# Patient Record
Sex: Male | Born: 1990 | Race: White | Hispanic: No | Marital: Single | Smoking: Former smoker
Health system: Southern US, Community
[De-identification: ages and names within clinical notes are randomized; demographics above are authoritative.]

## PROBLEM LIST (undated history)

## (undated) DIAGNOSIS — K37 Unspecified appendicitis: Secondary | ICD-10-CM

## (undated) HISTORY — DX: Unspecified appendicitis: K37

## (undated) HISTORY — PX: TONSILLECTOMY: SUR1361

---
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
## 2012-08-08 ENCOUNTER — Emergency Department: Payer: Self-pay | Admitting: Emergency Medicine

## 2012-08-09 LAB — CBC WITH DIFFERENTIAL/PLATELET
Basophil #: 0.1 10*3/uL (ref 0.0–0.1)
Basophil %: 0.6 %
Lymphocyte %: 26.7 %
MCH: 31.9 pg (ref 26.0–34.0)
Monocyte %: 9.6 %
Neutrophil %: 57.9 %
Platelet: 218 10*3/uL (ref 150–440)
RBC: 4.83 10*6/uL (ref 4.40–5.90)
RDW: 13.4 % (ref 11.5–14.5)

## 2012-08-09 LAB — COMPREHENSIVE METABOLIC PANEL
Alkaline Phosphatase: 95 U/L (ref 50–136)
Anion Gap: 7 (ref 7–16)
BUN: 15 mg/dL (ref 7–18)
Bilirubin,Total: 0.7 mg/dL (ref 0.2–1.0)
Calcium, Total: 8.5 mg/dL (ref 8.5–10.1)
Chloride: 104 mmol/L (ref 98–107)
Co2: 27 mmol/L (ref 21–32)
EGFR (African American): >60
EGFR (Non-African Amer.): >60
Glucose: 93 mg/dL (ref 65–99)
Osmolality: 276 (ref 275–301)
Potassium: 3.6 mmol/L (ref 3.5–5.1)
SGPT (ALT): 25 U/L (ref 12–78)
Sodium: 138 mmol/L (ref 136–145)
Total Protein: 6.9 g/dL (ref 6.4–8.2)

## 2012-08-09 LAB — TROPONIN I: Troponin-I: <0.02 ng/mL

## 2012-08-09 LAB — URINALYSIS, COMPLETE
Bacteria: NONE SEEN
Glucose,UR: NEGATIVE mg/dL (ref 0–75)
Ketone: NEGATIVE
Leukocyte Esterase: NEGATIVE
Nitrite: NEGATIVE
Ph: 7 (ref 4.5–8.0)
RBC,UR: 5 /HPF (ref 0–5)
Specific Gravity: 1.009 (ref 1.003–1.030)
WBC UR: <1 /HPF (ref 0–5)

## 2012-08-09 LAB — CK TOTAL AND CKMB (NOT AT ARMC): CK, Total: 425 U/L — ABNORMAL HIGH (ref 35–232)

## 2015-11-29 ENCOUNTER — Observation Stay
Admission: EM | Admit: 2015-11-29 | Discharge: 2015-11-30 | Disposition: A | Discharge status: Home / Self Care | Payer: 59 | Attending: General Surgery | Admitting: General Surgery

## 2015-11-29 ENCOUNTER — Encounter: Payer: Self-pay | Admitting: Emergency Medicine

## 2015-11-29 DIAGNOSIS — K353 Acute appendicitis with localized peritonitis, without perforation or gangrene: Secondary | ICD-10-CM

## 2015-11-29 DIAGNOSIS — K37 Unspecified appendicitis: Secondary | ICD-10-CM | POA: Diagnosis present

## 2015-11-29 DIAGNOSIS — F17210 Nicotine dependence, cigarettes, uncomplicated: Secondary | ICD-10-CM | POA: Insufficient documentation

## 2015-11-29 DIAGNOSIS — Z9889 Other specified postprocedural states: Secondary | ICD-10-CM | POA: Insufficient documentation

## 2015-11-29 DIAGNOSIS — K3580 Unspecified acute appendicitis: Secondary | ICD-10-CM | POA: Diagnosis present

## 2015-11-29 LAB — COMPREHENSIVE METABOLIC PANEL
ALT: 58 U/L (ref 17–63)
AST: 59 U/L — AB (ref 15–41)
Albumin: 4.7 g/dL (ref 3.5–5.0)
Alkaline Phosphatase: 75 U/L (ref 38–126)
Anion gap: 4 — ABNORMAL LOW (ref 5–15)
BUN: 15 mg/dL (ref 6–20)
CHLORIDE: 106 mmol/L (ref 101–111)
CO2: 27 mmol/L (ref 22–32)
CREATININE: 1.04 mg/dL (ref 0.61–1.24)
Calcium: 9.2 mg/dL (ref 8.9–10.3)
GFR calc non Af Amer: >60 mL/min (ref >60)
Glucose, Bld: 106 mg/dL — ABNORMAL HIGH (ref 65–99)
POTASSIUM: 4 mmol/L (ref 3.5–5.1)
SODIUM: 137 mmol/L (ref 135–145)
Total Bilirubin: 0.9 mg/dL (ref 0.3–1.2)
Total Protein: 7.6 g/dL (ref 6.5–8.1)

## 2015-11-29 LAB — URINALYSIS COMPLETE WITH MICROSCOPIC (ARMC ONLY)
BACTERIA UA: NONE SEEN
BILIRUBIN URINE: NEGATIVE
Glucose, UA: NEGATIVE mg/dL
HGB URINE DIPSTICK: NEGATIVE
Ketones, ur: NEGATIVE mg/dL
LEUKOCYTES UA: NEGATIVE
Nitrite: NEGATIVE
PH: 7 (ref 5.0–8.0)
PROTEIN: NEGATIVE mg/dL
RBC / HPF: NONE SEEN RBC/hpf (ref 0–5)
SQUAMOUS EPITHELIAL / LPF: NONE SEEN
Specific Gravity, Urine: 1.017 (ref 1.005–1.030)

## 2015-11-29 LAB — CBC
HEMATOCRIT: 47.8 % (ref 40.0–52.0)
Hemoglobin: 16.2 g/dL (ref 13.0–18.0)
MCH: 32 pg (ref 26.0–34.0)
MCHC: 34 g/dL (ref 32.0–36.0)
MCV: 94.1 fL (ref 80.0–100.0)
PLATELETS: 218 10*3/uL (ref 150–440)
RBC: 5.08 MIL/uL (ref 4.40–5.90)
RDW: 13.3 % (ref 11.5–14.5)
WBC: 19 10*3/uL — ABNORMAL HIGH (ref 3.8–10.6)

## 2015-11-29 LAB — LIPASE, BLOOD: LIPASE: 13 U/L (ref 11–51)

## 2015-11-29 NOTE — ED Notes (Signed)
Pt presents to eD with right lower abd pain. Pt states at onset about 2 hours ago he had a sudden onset of generalized dull abd pain. A little over an hour ago pt reports that the pain became sharp in nature and moved to his right lower abd. Denies vomiting or diarrhea. +nausea. Denies hx of kidney stones or urinary symptoms.

## 2015-11-30 ENCOUNTER — Emergency Department: Payer: 59 | Admitting: Certified Registered Nurse Anesthetist

## 2015-11-30 ENCOUNTER — Emergency Department: Payer: 59

## 2015-11-30 ENCOUNTER — Encounter
Admission: EM | Disposition: A | Discharge status: Home / Self Care | Payer: Self-pay | Source: Home / Self Care | Attending: Emergency Medicine

## 2015-11-30 DIAGNOSIS — K37 Unspecified appendicitis: Secondary | ICD-10-CM | POA: Diagnosis present

## 2015-11-30 HISTORY — PX: LAPAROSCOPIC APPENDECTOMY: SHX408

## 2015-11-30 HISTORY — DX: Unspecified appendicitis: K37

## 2015-11-30 LAB — CBC
HCT: 44.2 % (ref 40.0–52.0)
HEMOGLOBIN: 15.1 g/dL (ref 13.0–18.0)
MCH: 32.2 pg (ref 26.0–34.0)
MCHC: 34.1 g/dL (ref 32.0–36.0)
MCV: 94.6 fL (ref 80.0–100.0)
PLATELETS: 193 10*3/uL (ref 150–440)
RBC: 4.67 MIL/uL (ref 4.40–5.90)
RDW: 13.7 % (ref 11.5–14.5)
WBC: 15.9 10*3/uL — ABNORMAL HIGH (ref 3.8–10.6)

## 2015-11-30 SURGERY — APPENDECTOMY, LAPAROSCOPIC
Anesthesia: General | Wound class: Clean Contaminated

## 2015-11-30 MED ORDER — HYDROCODONE-ACETAMINOPHEN 5-325 MG PO TABS: 1.0000 | ORAL_TABLET | ORAL | Status: DC | PRN

## 2015-11-30 MED ORDER — SODIUM CHLORIDE 0.9 % IV BOLUS (SEPSIS)
1000.0000 mL | Freq: Once | INTRAVENOUS | Status: AC
  Administered 2015-11-30: 1000 mL via INTRAVENOUS

## 2015-11-30 MED ORDER — LIDOCAINE-EPINEPHRINE (PF) 1 %-1:200000 IJ SOLN
INTRAMUSCULAR | Status: DC | PRN
  Administered 2015-11-30: 15 mL

## 2015-11-30 MED ORDER — FENTANYL CITRATE (PF) 100 MCG/2ML IJ SOLN: 25.0000 ug | INTRAMUSCULAR | Status: DC | PRN

## 2015-11-30 MED ORDER — LIDOCAINE HCL (CARDIAC) 20 MG/ML IV SOLN
INTRAVENOUS | Status: DC | PRN
  Administered 2015-11-30: 80 mg via INTRAVENOUS

## 2015-11-30 MED ORDER — ROCURONIUM BROMIDE 100 MG/10ML IV SOLN
INTRAVENOUS | Status: DC | PRN
  Administered 2015-11-30: 10 mg via INTRAVENOUS
  Administered 2015-11-30: 25 mg via INTRAVENOUS

## 2015-11-30 MED ORDER — LACTATED RINGERS IV SOLN
INTRAVENOUS | Status: DC | PRN
  Administered 2015-11-30: 03:00:00 via INTRAVENOUS

## 2015-11-30 MED ORDER — MIDAZOLAM HCL 2 MG/2ML IJ SOLN
INTRAMUSCULAR | Status: DC | PRN
  Administered 2015-11-30: 1 mg via INTRAVENOUS

## 2015-11-30 MED ORDER — FENTANYL CITRATE (PF) 100 MCG/2ML IJ SOLN
INTRAMUSCULAR | Status: DC | PRN
  Administered 2015-11-30 (×2): 50 ug via INTRAVENOUS
  Administered 2015-11-30: 100 ug via INTRAVENOUS

## 2015-11-30 MED ORDER — LACTATED RINGERS IV SOLN
INTRAVENOUS | Status: DC
  Administered 2015-11-30 (×2): via INTRAVENOUS

## 2015-11-30 MED ORDER — DEXAMETHASONE SODIUM PHOSPHATE 10 MG/ML IJ SOLN
INTRAMUSCULAR | Status: DC | PRN
  Administered 2015-11-30: 10 mg via INTRAVENOUS

## 2015-11-30 MED ORDER — MORPHINE SULFATE (PF) 4 MG/ML IV SOLN
4.0000 mg | Freq: Once | INTRAVENOUS | Status: AC
  Administered 2015-11-30: 4 mg via INTRAVENOUS
  Filled 2015-11-30: qty 1

## 2015-11-30 MED ORDER — ONDANSETRON 8 MG PO TBDP: 4.0000 mg | ORAL_TABLET | Freq: Four times a day (QID) | ORAL | Status: DC | PRN

## 2015-11-30 MED ORDER — SUCCINYLCHOLINE CHLORIDE 20 MG/ML IJ SOLN
INTRAMUSCULAR | Status: DC | PRN
  Administered 2015-11-30: 200 mg via INTRAVENOUS

## 2015-11-30 MED ORDER — ONDANSETRON HCL 4 MG/2ML IJ SOLN: 4.0000 mg | Freq: Once | INTRAMUSCULAR | Status: DC | PRN

## 2015-11-30 MED ORDER — IOHEXOL 300 MG/ML  SOLN
125.0000 mL | Freq: Once | INTRAMUSCULAR | Status: AC | PRN
  Administered 2015-11-30: 125 mL via INTRAVENOUS

## 2015-11-30 MED ORDER — IOHEXOL 240 MG/ML SOLN
25.0000 mL | Freq: Once | INTRAMUSCULAR | Status: AC | PRN
  Administered 2015-11-30: 25 mL via ORAL

## 2015-11-30 MED ORDER — PROPOFOL 10 MG/ML IV BOLUS
INTRAVENOUS | Status: DC | PRN
  Administered 2015-11-30: 200 mg via INTRAVENOUS

## 2015-11-30 MED ORDER — SUGAMMADEX SODIUM 200 MG/2ML IV SOLN
INTRAVENOUS | Status: DC | PRN
  Administered 2015-11-30: 240 mg via INTRAVENOUS

## 2015-11-30 MED ORDER — MORPHINE SULFATE (PF) 2 MG/ML IV SOLN
2.0000 mg | INTRAVENOUS | Status: DC | PRN
  Administered 2015-11-30 (×2): 2 mg via INTRAVENOUS
  Filled 2015-11-30 (×3): qty 1

## 2015-11-30 MED ORDER — ONDANSETRON HCL 4 MG/2ML IJ SOLN: 4.0000 mg | Freq: Four times a day (QID) | INTRAMUSCULAR | Status: DC | PRN

## 2015-11-30 MED ORDER — HYDROCODONE-ACETAMINOPHEN 5-325 MG PO TABS
1.0000 | ORAL_TABLET | ORAL | Status: DC | PRN
  Administered 2015-11-30 (×3): 2 via ORAL
  Filled 2015-11-30 (×3): qty 2

## 2015-11-30 MED ORDER — ACETAMINOPHEN 10 MG/ML IV SOLN
INTRAVENOUS | Status: DC | PRN
  Administered 2015-11-30: 1000 mg via INTRAVENOUS

## 2015-11-30 MED ORDER — CEFAZOLIN SODIUM-DEXTROSE 2-3 GM-% IV SOLR
INTRAVENOUS | Status: DC | PRN
  Administered 2015-11-30: 2 g via INTRAVENOUS

## 2015-11-30 MED ORDER — DOCUSATE SODIUM 100 MG PO CAPS
100.0000 mg | ORAL_CAPSULE | Freq: Two times a day (BID) | ORAL | Status: DC
  Administered 2015-11-30: 100 mg via ORAL
  Filled 2015-11-30: qty 1

## 2015-11-30 MED ORDER — ONDANSETRON HCL 4 MG/2ML IJ SOLN
4.0000 mg | Freq: Once | INTRAMUSCULAR | Status: AC
  Administered 2015-11-30: 4 mg via INTRAVENOUS
  Filled 2015-11-30: qty 2

## 2015-11-30 MED ORDER — GLYCOPYRROLATE 0.2 MG/ML IJ SOLN
INTRAMUSCULAR | Status: DC | PRN
  Administered 2015-11-30: 0.2 mg via INTRAVENOUS

## 2015-11-30 SURGICAL SUPPLY — 42 items
ADHESIVE MASTISOL STRL (MISCELLANEOUS) ×3 IMPLANT
APPLIER CLIP 5 13 M/L LIGAMAX5 (MISCELLANEOUS)
BLADE SURG SZ11 CARB STEEL (BLADE) ×3 IMPLANT
CANISTER SUCT 1200ML W/VALVE (MISCELLANEOUS) ×3 IMPLANT
CANNULA DILATOR  5MM W/SLV (CANNULA) ×2
CANNULA DILATOR 5 W/SLV (CANNULA) ×4 IMPLANT
CATH TRAY 16F METER LATEX (MISCELLANEOUS) ×3 IMPLANT
CHLORAPREP W/TINT 26ML (MISCELLANEOUS) ×3 IMPLANT
CLIP APPLIE 5 13 M/L LIGAMAX5 (MISCELLANEOUS) IMPLANT
CLOSURE WOUND 1/2 X4 (GAUZE/BANDAGES/DRESSINGS)
CUTTER FLEX LINEAR 45M (STAPLE) ×3 IMPLANT
DRESSING TELFA 4X3 1S ST N-ADH (GAUZE/BANDAGES/DRESSINGS) ×3 IMPLANT
DRSG TEGADERM 2-3/8X2-3/4 SM (GAUZE/BANDAGES/DRESSINGS) ×9 IMPLANT
ELECT REM PT RETURN 9FT ADLT (ELECTROSURGICAL) ×3
ELECTRODE REM PT RTRN 9FT ADLT (ELECTROSURGICAL) ×1 IMPLANT
ENDOPOUCH RETRIEVER 10 (MISCELLANEOUS) ×3 IMPLANT
GLOVE BIO SURGEON STRL SZ7.5 (GLOVE) ×3 IMPLANT
GLOVE INDICATOR 8.0 STRL GRN (GLOVE) ×3 IMPLANT
GOWN STRL REUS W/ TWL LRG LVL3 (GOWN DISPOSABLE) ×2 IMPLANT
GOWN STRL REUS W/TWL LRG LVL3 (GOWN DISPOSABLE) ×4
IRRIGATION STRYKERFLOW (MISCELLANEOUS) ×1 IMPLANT
IRRIGATOR STRYKERFLOW (MISCELLANEOUS) ×3
IV NS 1000ML (IV SOLUTION) ×2
IV NS 1000ML BAXH (IV SOLUTION) ×1 IMPLANT
KIT RM TURNOVER STRD PROC AR (KITS) ×3 IMPLANT
LABEL OR SOLS (LABEL) ×3 IMPLANT
LIQUID BAND (GAUZE/BANDAGES/DRESSINGS) ×3 IMPLANT
NDL INSUFF ACCESS 14 VERSASTEP (NEEDLE) ×3 IMPLANT
NEEDLE HYPO 25X1 1.5 SAFETY (NEEDLE) ×3 IMPLANT
NEEDLE VERESS 14GA 120MM (NEEDLE) IMPLANT
NS IRRIG 500ML POUR BTL (IV SOLUTION) ×3 IMPLANT
PACK LAP CHOLECYSTECTOMY (MISCELLANEOUS) ×3 IMPLANT
RELOAD 45 VASCULAR/THIN (ENDOMECHANICALS) ×6 IMPLANT
RELOAD STAPLE TA45 3.5 REG BLU (ENDOMECHANICALS) ×3 IMPLANT
SLEEVE ENDOPATH XCEL 5M (ENDOMECHANICALS) IMPLANT
STRIP CLOSURE SKIN 1/2X4 (GAUZE/BANDAGES/DRESSINGS) IMPLANT
SUT MNCRL 4-0 (SUTURE) ×2
SUT MNCRL 4-0 27XMFL (SUTURE) ×1
SUTURE MNCRL 4-0 27XMF (SUTURE) ×1 IMPLANT
TROCAR XCEL 12X100 BLDLESS (ENDOMECHANICALS) ×3 IMPLANT
TROCAR XCEL NON-BLD 5MMX100MML (ENDOMECHANICALS) IMPLANT
TUBING INSUFFLATOR HI FLOW (MISCELLANEOUS) ×3 IMPLANT

## 2015-11-30 NOTE — Discharge Summary (Signed)
Patient ID: Isaac BeachCharles D Marquez MRN: [id number] DOB/AGE: Jan 18, 1991 24 y.o.  Admit date: 11/29/2015 Discharge date: 11/30/2015  Discharge Diagnoses:  Appendicitis  Procedures Performed: Laparoscopic appendectomy  Discharged Condition: good  Hospital Course: Patient taken the operating room for laparoscopic appendectomy. Had an uneventful surgery. Was able to tolerate a diet, ambulation, had pain controlled on oral medications prior discharge. Discharged home first postoperative day.  Discharge Orders:  discharge home  Disposition: Home  Discharge Medications:    Medication List    TAKE these medications        HYDROcodone-acetaminophen 5-325 MG tablet  Commonly known as:  NORCO/VICODIN  Take 1-2 tablets by mouth every 4 (four) hours as needed for moderate pain.         Follwup: Follow-up Information    [facility] [place]. Schedule an appointment as soon as possible for a visit in 2 weeks.   Specialty:  General Surgery   Why:  postop   Contact information:   [address], [address] [place] [id number] [phone]      Signed: Ricarda FrameCharles Ineta Sinning 11/30/2015, 6:03 PM

## 2015-11-30 NOTE — Transfer of Care (Signed)
Immediate Anesthesia Transfer of Care Note  Patient: Isaac BeachCharles D Herder  Procedure(s) Performed: Procedure(s): APPENDECTOMY LAPAROSCOPIC (N/A)  Patient Location: PACU  Anesthesia Type:General  Level of Consciousness: sedated  Airway & Oxygen Therapy: Patient Spontanous Breathing  Post-op Assessment: Report given to RN and Post -op Vital signs reviewed and stable  Post vital signs: Reviewed and stable  Last Vitals:  Filed Vitals:   11/30/15 0130 11/30/15 0406  BP: 148/94 150/89  Pulse: 88 116  Temp:  36.3 C  Resp:  12    Complications: No apparent anesthesia complications

## 2015-11-30 NOTE — Op Note (Addendum)
laparascopic appendectomy   Isaac Marquez Date of operation:  11/30/2015  Indications: The patient presented with a history of  abdominal pain. Workup has revealed findings consistent with acute appendicitis.  Pre-operative Diagnosis: Acute appendicitis without mention of peritonitis  Post-operative Diagnosis: Acute appendicitis without mention of peritonitis  Surgeon: Isaac Gonczy, MD  Anesthesia: General with endotracheal tube  Procedure Details  The patient was seen again in the preop area. The options of surgery versus observation were reviewed with the patient and/or family. The risks of bleeding, infection, recurrence of symptoms, negative laparoscopy, potential for an open procedure, bowel injury, abscess or infection, were all reviewed as well. The patient was taken to Operating Room, identified as Isaac Gonczy MD and the procedure verified as laparoscopic appendectomy. A Time Out was held and the above information confirmed.  The patient was placed in the supine position and general anesthesia was induced.  Antibiotic prophylaxis was administered and VT E prophylaxis was in place. A Foley catheter was placed by the nursing staff.   The abdomen was prepped and draped in a sterile fashion. An supraumbilical incision was made. A Veress needle was placed and pneumoperitoneum was obtained. A 5 mm trocar port was placed without difficulty and the abdominal cavity was explored.  Under direct vision a 5 mm suprapubic port was placed and a 13 mm left lateral port was placed all under direct vision.  The appendix was identified and found to be acutely inflamed with fibrinous exudate but no evidence of perforation The appendix was carefully dissected. The base of the appendix was dissected out and divided with a standard load Endo GIA. The mesoappendix was divided with a vascular load Endo GIA.   The appendix was passed out through the left lateral port site with the aid of an Endo Catch bag. The  right lower quadrant and pelvis was then irrigated with copious amounts of normal saline which was aspirated. Inspection  failed to identify any additional bleeding and there were no signs of bowel injury. Therefore the left lateral port site was closed under direct vision utilizing an Endo Close technique with 0 Vicryl interrupted sutures, all under direct vision.   Again the right lower quadrant was inspected there was no sign of bleeding or bowel injury therefore pneumoperitoneum was released, all ports were removed and the skin incisions were approximated with subcuticular 4-0 Monocryl. Skin glue was then used.  The patient tolerated the procedure well, there were no complications. The sponge lap and needle count were correct at the end of the procedure.  The patient was taken to the recovery room in stable condition to be admitted for continued care.  Findings: Acute appendicitis without perforation  Estimated Blood Loss: 20cc                  Specimens: appendix         Complications:  None                 Isaac Gonczy MD

## 2015-11-30 NOTE — ED Provider Notes (Signed)
[facility] Emergency Department Provider Note  ____________________________________________  Time seen: Approximately 0004 AM  I have reviewed the triage vital signs and the nursing notes.   HISTORY  Chief Complaint Abdominal Pain    HPI Isaac Marquez is a 25 y.o. male who comes into the hospital today with right lower quadrant pain. The patient reports that the pain started around 7 PM today. He reports that earlier today he was fine but then he developed some nausea with no vomiting or diarrhea. The patient denies any fevers but reports he is pain and pressure in his right lower quadrant when he urinates. He last ate around 3:30 today. He reports that since then he's had no appetite. The pain varies from a 2-8 out of 10 in intensity. The patient reports that he moves too much the pain is worse. The patient has never had pain like this in the past. He came into the hospital to get checked out.   History reviewed. No pertinent past medical history.  There are no active problems to display for this patient.   Past Surgical History  Procedure Laterality Date  . Tonsillectomy      No current outpatient prescriptions  Allergies Review of patient's allergies indicates no known allergies.  No family history on file.  Social History Social History  Substance Use Topics  . Smoking status: Current Every Day Smoker -- 1.00 packs/day    Types: Cigarettes  . Smokeless tobacco: Current User  . Alcohol Use: Yes    Review of Systems Constitutional: No fever/chills Eyes: No visual changes. ENT: No sore throat. Cardiovascular: Denies chest pain. Respiratory: Denies shortness of breath. Gastrointestinal: abdominal pain and nausea, no vomiting.  No diarrhea.  No constipation. Genitourinary: Negative for dysuria. Musculoskeletal: Negative for back pain. Skin: Negative for rash. Neurological: Negative for headaches, focal weakness or numbness.  10-point  ROS otherwise negative.  ____________________________________________   PHYSICAL EXAM:  VITAL SIGNS: ED Triage Vitals  Enc Vitals Group     BP 11/29/15 2157 127/69 mmHg     Pulse Rate 11/29/15 2157 94     Resp 11/29/15 2157 20     Temp 11/29/15 2157 98.4 F (36.9 C)     Temp Source 11/29/15 2157 Oral     SpO2 11/29/15 2157 98 %     Weight 11/29/15 2157 265 lb (120.203 kg)     Height 11/29/15 2157  (1.905 m)     Head Cir --      Peak Flow --      Pain Score 11/29/15 2158 8     Pain Loc --      Pain Edu? --      Excl. in GC? --     Constitutional: Alert and oriented. Well appearing and in mild distress. Eyes: Conjunctivae are normal. PERRL. EOMI. Head: Atraumatic. Nose: No congestion/rhinnorhea. Mouth/Throat: Mucous membranes are moist.  Oropharynx non-erythematous. Cardiovascular: Normal rate, regular rhythm. Grossly normal heart sounds.  Good peripheral circulation. Respiratory: Normal respiratory effort.  No retractions. Lungs CTAB. Gastrointestinal: Soft with right lower quadrant tenderness to palpation. No distention. Positive bowel sounds Musculoskeletal: No lower extremity tenderness nor edema.   Neurologic:  Normal speech and language.  Skin:  Skin is warm, dry and intact.  Psychiatric: Mood and affect are normal.   ____________________________________________   LABS (all labs ordered are listed, but only abnormal results are displayed)  Labs Reviewed  COMPREHENSIVE METABOLIC PANEL - Abnormal; Notable for the following:  Glucose, Bld 106 (*)    AST 59 (*)    Anion gap 4 (*)    All other components within normal limits  CBC - Abnormal; Notable for the following:    WBC 19.0 (*)    All other components within normal limits  URINALYSIS COMPLETEWITH MICROSCOPIC ([facility] ONLY) - Abnormal; Notable for the following:    Color, Urine YELLOW (*)    APPearance CLEAR (*)    All other components within normal limits  LIPASE, BLOOD    ____________________________________________  EKG  none ____________________________________________  RADIOLOGY  CT abd and pelvis: Distention of the appendix to 1.0 cm in maximal diameter with mild surrounding soft tissue inflammation, raising concern for mild appendicitis though it is difficult to fully differentiate from adjacent loops of small bowel, a few mildly prominent nodes seen. No evidence of perforation or abscess at this time. ____________________________________________   PROCEDURES  Procedure(s) performed: None  Critical Care performed: No  ____________________________________________   INITIAL IMPRESSION / ASSESSMENT AND PLAN / ED COURSE  Pertinent labs & imaging results that were available during my care of the patient were reviewed by me and considered in my medical decision making (see chart for details).  This is a 25 year old male who comes into the hospital today with right lower quadrant pain. The patient did receive a dose of morphine as well as a liter of normal saline. It appears as though he does have appendicitis. I contacted the surgeon on-call who will take the patient to the operating room. Otherwise the patient has no further complaints or concerns at this time. ____________________________________________   FINAL CLINICAL IMPRESSION(S) / ED DIAGNOSES  Final diagnoses:  Acute appendicitis with localized peritonitis      Rebecka ApleyAllison P Eisa Necaise, MD 11/30/15 [phone]

## 2015-11-30 NOTE — Anesthesia Preprocedure Evaluation (Signed)
Anesthesia Evaluation  Patient identified by MRN, date of birth, ID band Patient awake    Reviewed: Allergy & Precautions, NPO status , Patient's Chart, lab work & pertinent test results  Airway Mallampati: II  TM Distance: >3 FB Neck ROM: Full    Dental no notable dental hx.    Pulmonary Current Smoker,    Pulmonary exam normal        Cardiovascular negative cardio ROS Normal cardiovascular exam     Neuro/Psych negative neurological ROS  negative psych ROS   GI/Hepatic Neg liver ROS, Acute appendix   Endo/Other  negative endocrine ROS  Renal/GU negative Renal ROS  negative genitourinary   Musculoskeletal negative musculoskeletal ROS (+)   Abdominal Normal abdominal exam  (+)   Peds negative pediatric ROS (+)  Hematology negative hematology ROS (+)   Anesthesia Other Findings   Reproductive/Obstetrics                             Anesthesia Physical Anesthesia Plan  ASA: II and emergent  Anesthesia Plan: General   Post-op Pain Management:    Induction: Intravenous and Rapid sequence  Airway Management Planned: Oral ETT  Additional Equipment:   Intra-op Plan:   Post-operative Plan: Extubation in OR  Informed Consent: I have reviewed the patients History and Physical, chart, labs and discussed the procedure including the risks, benefits and alternatives for the proposed anesthesia with the patient or authorized representative who has indicated his/her understanding and acceptance.   Dental advisory given  Plan Discussed with: CRNA and Surgeon  Anesthesia Plan Comments:         Anesthesia Quick Evaluation

## 2015-11-30 NOTE — Anesthesia Procedure Notes (Signed)
Procedure Name: Intubation Date/Time: 11/30/2015 3:05 AM Performed by: Ginger CarneMICHELET, Estephania Licciardi Pre-anesthesia Checklist: Patient identified, Emergency Drugs available, Suction available, Patient being monitored and Timeout performed Patient Re-evaluated:Patient Re-evaluated prior to inductionOxygen Delivery Method: Circle system utilized Preoxygenation: Pre-oxygenation with 100% oxygen Intubation Type: IV induction, Rapid sequence and Cricoid Pressure applied Ventilation: Oral airway inserted - appropriate to patient size Laryngoscope Size: Hyacinth MeekerMiller and 2 Grade View: Grade I Tube type: Oral Tube size: 8.0 mm Number of attempts: 1 Airway Equipment and Method: Stylet Placement Confirmation: ETT inserted through vocal cords under direct vision,  positive ETCO2 and breath sounds checked- equal and bilateral Secured at: 23 cm Tube secured with: Tape Dental Injury: Teeth and Oropharynx as per pre-operative assessment

## 2015-11-30 NOTE — H&P (Signed)
Patient ID: Isaac Marquez, male   DOB: 1991-04-14, 25 y.o.   MRN: [id number]  History of Present Illness Isaac BeachCharles D Arismendez is a 25 y.o. male with right lower quadrant pain since 1900 last night.  Last PO was 1500 yesterday.  Pain started as vague crampy periumbilical pain and then moved to right lower quadrant, now sharp and 6/10 in intensity.  No alleviating factors.  Some associated nausea, no vomiting.  Chills, no fevers.  No prior episodes of similar symptoms.  Denies diarrhea or constipation.  Past Medical History History reviewed. No pertinent past medical history.     Past Surgical History  Procedure Laterality Date  . Tonsillectomy      No Known Allergies  No current facility-administered medications for this encounter.   No current outpatient prescriptions on file.    Family History None  Social History Social History  Substance Use Topics  . Smoking status: Current Every Day Smoker -- 1.00 packs/day    Types: Cigarettes  . Smokeless tobacco: Current User  . Alcohol Use: Yes       ROS  As per HPI, otherwise 12 point review of systems is negative   Physical Exam Blood pressure 148/94, pulse 88, temperature 98.4 F (36.9 C), temperature source Oral, resp. rate 20, height 6\' 3"  (1.905 m), weight 265 lb (120.203 kg), SpO2 96 %.  CONSTITUTIONAL: well appearing male in NAD EYES: Pupils equal, round, and reactive to light, Sclera non-icteric. EARS, NOSE, MOUTH AND THROAT: The oropharynx is clear. Oral mucosa is pink and moist. Hearing is intact to voice.  NECK: Trachea is midline, and there is no jugular venous distension.  LYMPH NODES:  Lymph nodes in the neck are not enlarged. RESPIRATORY:   Normal respiratory effort without pathologic use of accessory muscles. CARDIOVASCULAR: Heart is regular rate and rhythm GI: The abdomen is soft, nondistended, tender to palpation in RLQ at McBurney's point.  -Rovsings.. There were no palpable masses. There was no  hepatosplenomegaly. MUSCULOSKELETAL:  Normal muscle strength and tone in all four extremities.    SKIN: Skin turgor is normal. There are no pathologic skin lesions.  NEUROLOGIC:  Motor and sensation is grossly normal.  Cranial nerves are grossly intact. PSYCH:  Alert and oriented to person, place and time. Affect is normal.  Data Reviewed WBC: 19.0 CT C/A/P:  IMPRESSION: Distention of the appendix to 1.0 cm in maximal diameter, with mild surrounding soft tissue inflammation, raising concern for mild appendicitis, though this is difficult to fully differentiate from adjacent loops of small bowel. Few mildly prominent pericecal nodes seen. No evidence of perforation or abscess formation at this time.   I have personally reviewed the patient's imaging and medical records.    Assessment   25 year old male with right lower quadrant pain, dilated appendix and WBC 19.0, all consistent with a diagnosis of acute appendicitis  Plan    Will plan for laparoscopic appendectomy.  Risks, benefits and alternatives including but not limited to infection, bleeding and injury to adjacent structures were discussed with the patient, and all concerns and questions were addressed.  He will receive 1g mefoxin perioperatively, will place a foley at start of procedure.  NPO, IVF.    Face-to-face time spent with the patient and care providers was 30 minutes, with more than 50% of the time spent counseling, educating, and coordinating care of the patient.     Italyhad Oree Hislop 11/30/2015, 2:05 AM

## 2015-11-30 NOTE — Final Progress Note (Signed)
Day of Surgery   Subjective:  Patient doing well. Tolerating a diet and ambulating. Pain well-controlled on oral medications and he desires to go home.  Vital signs in last 24 hours: Temp:  [97.3 F (36.3 C)-98.4 F (36.9 C)] 98.2 F (36.8 C) (03/19 1342) Pulse Rate:  [70-116] 70 (03/19 1342) Resp:  [12-22] 18 (03/19 1342) BP: (117-168)/(58-94) 120/58 mmHg (03/19 1342) SpO2:  [93 %-100 %] 97 % (03/19 1342) Weight:  [120.203 kg (265 lb)] 120.203 kg (265 lb) (03/18 2157)    Intake/Output from previous day: 03/18 0701 - 03/19 0700 In: 1100 [I.V.:1100] Out: 195 [Urine:175; Blood:20]  GI: Abdomen soft, nondistended, purple tender to palpation at incision sites without evidence of erythema or drainage.  Lab Results:  CBC  Recent Labs  11/29/15 2202 11/30/15 0648  WBC 19.0* 15.9*  HGB 16.2 15.1  HCT 47.8 44.2  PLT 218 193   CMP     Component Value Date/Time   NA 137 11/29/2015 2202   NA 138 08/09/2012 2351   K 4.0 11/29/2015 2202   K 3.6 08/09/2012 2351   CL 106 11/29/2015 2202   CL 104 08/09/2012 2351   CO2 27 11/29/2015 2202   CO2 27 08/09/2012 2351   GLUCOSE 106* 11/29/2015 2202   GLUCOSE 93 08/09/2012 2351   BUN 15 11/29/2015 2202   BUN 15 08/09/2012 2351   CREATININE 1.04 11/29/2015 2202   CREATININE 0.98 08/09/2012 2351   CALCIUM 9.2 11/29/2015 2202   CALCIUM 8.5 08/09/2012 2351   PROT 7.6 11/29/2015 2202   PROT 6.9 08/09/2012 2351   ALBUMIN 4.7 11/29/2015 2202   ALBUMIN 3.7 08/09/2012 2351   AST 59* 11/29/2015 2202   AST 22 08/09/2012 2351   ALT 58 11/29/2015 2202   ALT 25 08/09/2012 2351   ALKPHOS 75 11/29/2015 2202   ALKPHOS 95 08/09/2012 2351   BILITOT 0.9 11/29/2015 2202   BILITOT 0.7 08/09/2012 2351   GFRNONAA >60 11/29/2015 2202   GFRNONAA >60 08/09/2012 2351   GFRAA >60 11/29/2015 2202   GFRAA >60 08/09/2012 2351   PT/INR No results for input(s): LABPROT, INR in the last 72 hours.  Studies/Results: Ct Abdomen Pelvis W  Contrast  11/30/2015  CLINICAL DATA:  Acute onset of right lower quadrant abdominal pain and nausea. Initial encounter. EXAM: CT ABDOMEN AND PELVIS WITH CONTRAST TECHNIQUE: Multidetector CT imaging of the abdomen and pelvis was performed using the standard protocol following bolus administration of intravenous contrast. CONTRAST:  125mL OMNIPAQUE IOHEXOL 300 MG/ML  SOLN COMPARISON:  None. FINDINGS: The visualized lung bases are clear. The liver and spleen are unremarkable in appearance. The gallbladder is within normal limits. The pancreas and adrenal glands are unremarkable. The kidneys are unremarkable in appearance. There is no evidence of hydronephrosis. No renal or ureteral stones are seen. No perinephric stranding is appreciated. No free fluid is identified. The small bowel is unremarkable in appearance. The stomach is within normal limits. No acute vascular abnormalities are seen. The appendix appears distended to 1.0 cm in maximal diameter, with mild surrounding soft tissue inflammation, raising concern for mild appendicitis, though this is difficult to fully differentiate from adjacent loops of small bowel. A few mildly prominent pericecal nodes are seen. The colon is unremarkable in appearance. The bladder is mildly distended and grossly unremarkable. A small urachal remnant is incidentally seen. The prostate remains normal in size. No inguinal lymphadenopathy is seen. No acute osseous abnormalities are identified. IMPRESSION: Distention of the appendix to 1.0 cm  in maximal diameter, with mild surrounding soft tissue inflammation, raising concern for mild appendicitis, though this is difficult to fully differentiate from adjacent loops of small bowel. Few mildly prominent pericecal nodes seen. No evidence of perforation or abscess formation at this time. These results were called by telephone at the time of interpretation on 11/30/2015 at 1:01 am to Dr. Lucrezia Europe, who verbally acknowledged these  results. Electronically Signed   By: Roanna Raider M.D.   On: 11/30/2015 01:02    Assessment/Plan: 25 year old male status post laparoscopic appendectomy for acute appendicitis. Doing well. Desires to go home. We'll discharge home with follow-up in clinic in 2 weeks for wound check.   Ricarda Frame, MD FACS General Surgeon  11/30/2015

## 2015-11-30 NOTE — ED Notes (Signed)
Pt has signed consent

## 2015-11-30 NOTE — Discharge Instructions (Signed)
° °  Laparoscopic Appendectomy, Adult, Care After Refer to this sheet in the next few weeks. These instructions provide you with information on caring for yourself after your procedure. Your caregiver may also give you more specific instructions. Your treatment has been planned according to current medical practices, but problems sometimes occur. Call your caregiver if you have any problems or questions after your procedure. HOME CARE INSTRUCTIONS  Do not drive while taking narcotic pain medicines.  Use stool softener if you become constipated from your pain medicines.  Change your bandages (dressings) as directed.  Keep your wounds clean and dry. You may wash the wounds gently with soap and water. Gently pat the wounds dry with a clean towel.  Do not take baths, swim, or use hot tubs for 10 days, or as instructed by your caregiver.  Only take over-the-counter or prescription medicines for pain, discomfort, or fever as directed by your caregiver.  You may continue your normal diet as directed.  Do not lift more than 10 pounds (4.5 kg) or play contact sports for 3 weeks, or as directed.  Slowly increase your activity after surgery.  Take deep breaths to avoid getting a lung infection (pneumonia). SEEK MEDICAL CARE IF:  You have redness, swelling, or increasing pain in your wounds.  You have pus coming from your wounds.  You have drainage from a wound that lasts longer than 1 day.  You notice a bad smell coming from the wounds or dressing.  Your wound edges break open after stitches (sutures) have been removed.  You notice increasing pain in the shoulders (shoulder strap areas) or near your shoulder blades.  You develop dizzy episodes or fainting while standing.  You develop shortness of breath.  You develop persistent nausea or vomiting.  You cannot control your bowel functions or lose your appetite.  You develop diarrhea. SEEK IMMEDIATE MEDICAL CARE IF:   You have a  fever.  You develop a rash.  You have difficulty breathing or sharp pains in your chest.  You develop any reaction or side effects to medicines given. MAKE SURE YOU:  Understand these instructions.  Will watch your condition.  Will get help right away if you are not doing well or get worse.   This information is not intended to replace advice given to you by your health care provider. Make sure you discuss any questions you have with your health care provider.   Document Released: 08/30/2005 Document Revised: 01/14/2015 Document Reviewed: 02/17/2015 Elsevier Interactive Patient Education 2016 [employer]   Follow all MD discharge instructions. Take all medications as prescribed. Keep all follow up appointments. If your symptoms return, call your doctor. If you experience any new symptoms that are of concern to you or that are bothersome to you, call your doctor. For all questions and/or concerns, call your doctor.  If you experience any bleeding or discharge from your incision sites, redness or swelling at your incision sites, fever, pain uncontrolled by medication, increase in pain, or any nausea, call your doctor.    If you have a medical emergency, call 911

## 2015-12-01 ENCOUNTER — Encounter: Payer: Self-pay | Admitting: General Surgery

## 2015-12-02 LAB — SURGICAL PATHOLOGY

## 2015-12-02 NOTE — Anesthesia Postprocedure Evaluation (Signed)
Anesthesia Post Note  Patient: Maren BeachCharles D Sweaney  Procedure(s) Performed: Procedure(s) (LRB): APPENDECTOMY LAPAROSCOPIC (N/A)  Patient location during evaluation: PACU Anesthesia Type: General Level of consciousness: awake and alert and oriented Pain management: pain level controlled Vital Signs Assessment: post-procedure vital signs reviewed and stable Respiratory status: spontaneous breathing and respiratory function stable Cardiovascular status: blood pressure returned to baseline Postop Assessment: no headache Anesthetic complications: no    Last Vitals:  Filed Vitals:   11/30/15 0702 11/30/15 1342  BP: 117/63 120/58  Pulse: 77 70  Temp: 36.7 C 36.8 C  Resp: 18 18    Last Pain:  Filed Vitals:   11/30/15 1710  PainSc: 3                  Nguyen Butler

## 2015-12-04 ENCOUNTER — Telehealth: Payer: Self-pay

## 2015-12-04 NOTE — Telephone Encounter (Signed)
Called patient to let him know that his FMLA Form was filled out and faxed. Patient requested for a copy and to be mailed. I told him that I would. Reminded patient of his appointment on 12/11/2015 with Dr. Orvis BrillLoflin. Patient also wanted to know if he could go back to work on 12/09/2015. I told him that he needed to be cleared by the surgeon first before going back to work. Patient understood.

## 2015-12-11 ENCOUNTER — Encounter: Payer: Self-pay | Admitting: Surgery

## 2015-12-11 ENCOUNTER — Ambulatory Visit (INDEPENDENT_AMBULATORY_CARE_PROVIDER_SITE_OTHER): Payer: 59 | Admitting: Surgery

## 2015-12-11 VITALS — BP 157/77 | HR 91 | Temp 97.7°F | Ht 75.0 in | Wt 265.0 lb

## 2015-12-11 DIAGNOSIS — K353 Acute appendicitis with localized peritonitis, without perforation or gangrene: Secondary | ICD-10-CM

## 2015-12-11 NOTE — Patient Instructions (Signed)
Please see work note provided.  Please call our office with any questions or concerns.  Please do not submerge in a tub, hot tub, or pool until incisions are completely sealed.  Use sun block to incision area over the next year if this area will be exposed to sun. This helps decrease scarring.  If you develop redness, drainage, or pain at incision sites- call our office immediately and speak with a nurse.

## 2015-12-11 NOTE — Progress Notes (Signed)
25 year old male status post lap scopic appendectomy for acute appendicitis. Patient states that he is doing well no longer having to take pain medicine. Patient states these have them down movements regularly. Patient states that he is eating well with no issues.  Filed Vitals:   12/11/15 1402  BP: 157/77  Pulse: 91  Temp: 97.7 F (36.5 C)   PE:  Gen: NAD Abd: soft, nontender, incisions c/d/i no erythema or drainage  A/P: An healing well after leper scopic appendectomy discussed his pathology of acute appendicitis with him. Patient was instructed to not lift anything over 15-20 pounds for the next 3 weeks but otherwise can return to work with restrictions. She is given. He can return with any questions or concerns.

## 2015-12-16 ENCOUNTER — Encounter: Payer: Self-pay | Admitting: Surgery

## 2016-09-07 IMAGING — CT CT ABD-PELV W/ CM
1 of 2 series · 15 of 32 positions shown, 19 images · IV contrast (omnipaque)
Comparison: None.

CLINICAL DATA: Acute onset of right lower quadrant abdominal pain
and nausea. Initial encounter.

EXAM:
CT ABDOMEN AND PELVIS WITH CONTRAST
TECHNIQUE: Multidetector CT imaging of the abdomen and pelvis was performed
using the standard protocol following bolus administration of
intravenous contrast.
CONTRAST:  125mL OMNIPAQUE IOHEXOL 300 MG/ML  SOLN

[Series 2: routine abd pel with · axial · 0.76mm/px · z∈[-594,-154]mm · 15 of 98 slices shown, 19 images]
[im 5/98  soft-tissue]
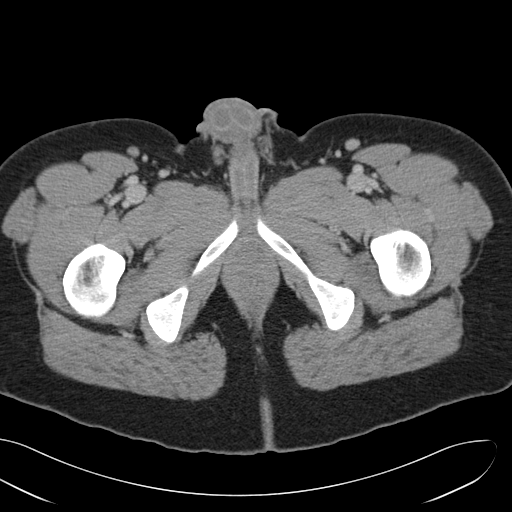
[im 5/98  bone]
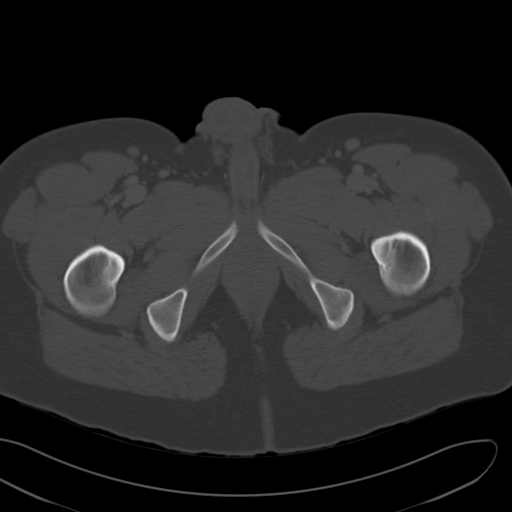
[im 13/98  soft-tissue]
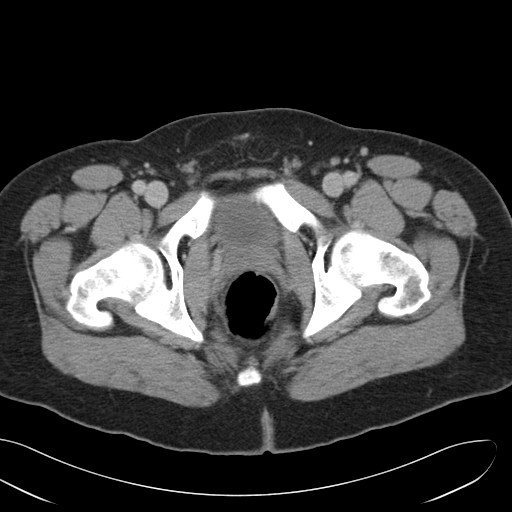
[im 21/98  soft-tissue]
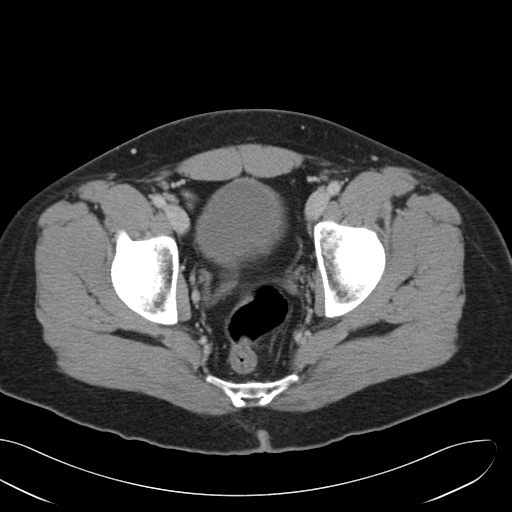
[im 29/98  soft-tissue]
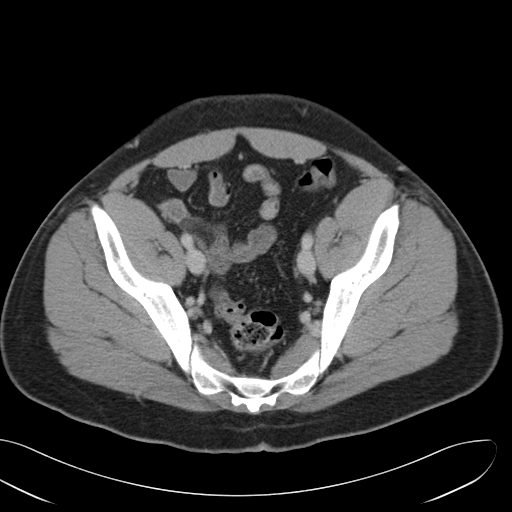
[im 33/98  soft-tissue]
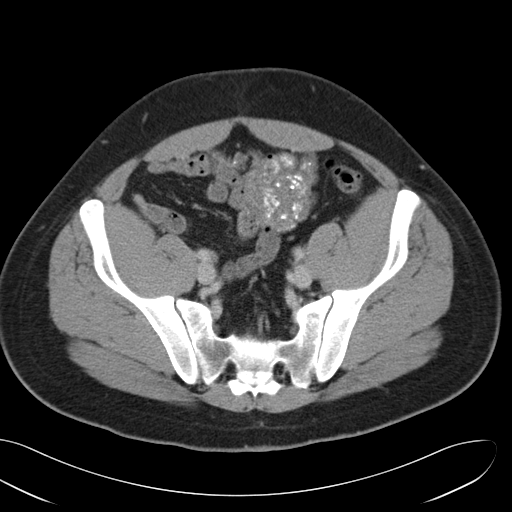
[im 41/98  soft-tissue]
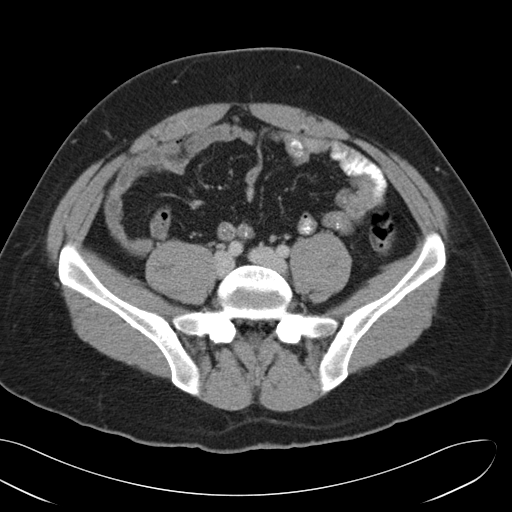
[im 49/98  soft-tissue]
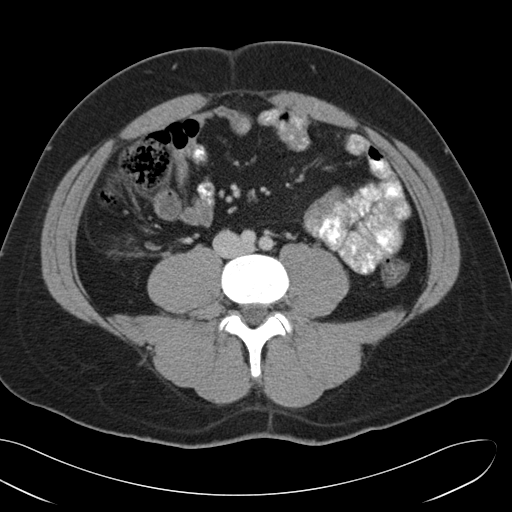
[im 57/98  soft-tissue]
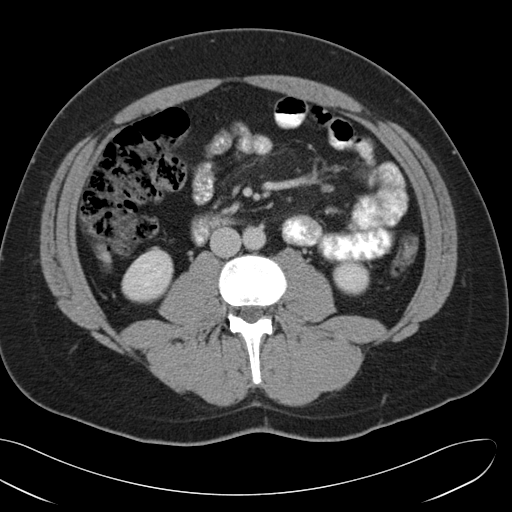
[im 65/98  soft-tissue]
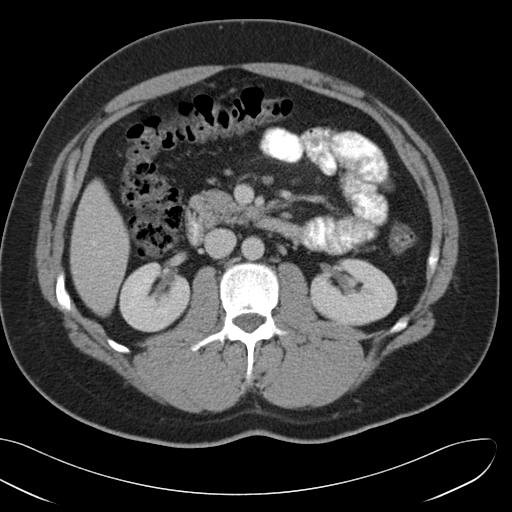
[im 65/98  bone]
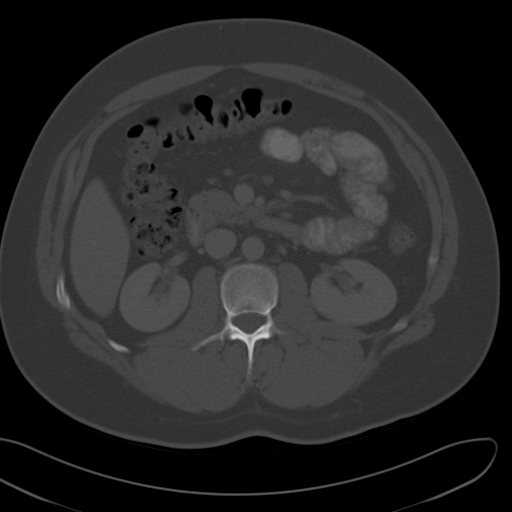
[im 69/98  soft-tissue]
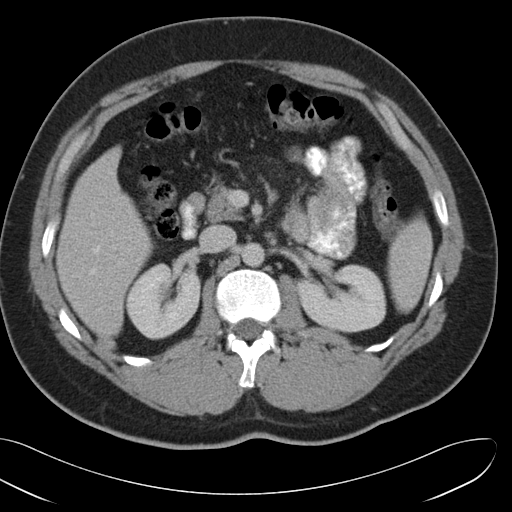
[im 77/98  soft-tissue]
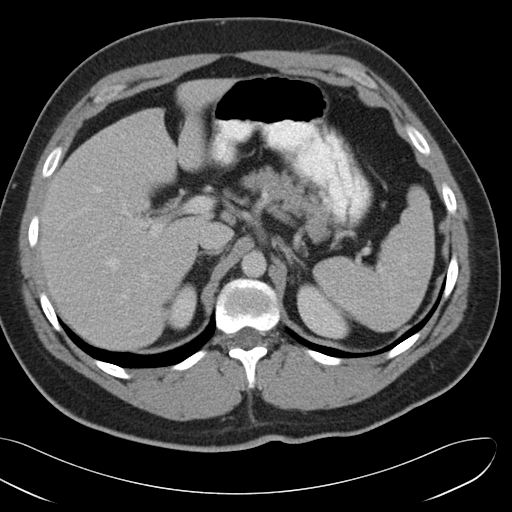
[im 81/98  lung]
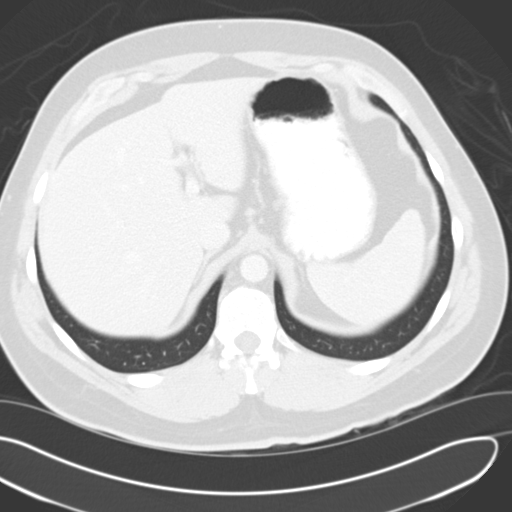
[im 85/98  soft-tissue]
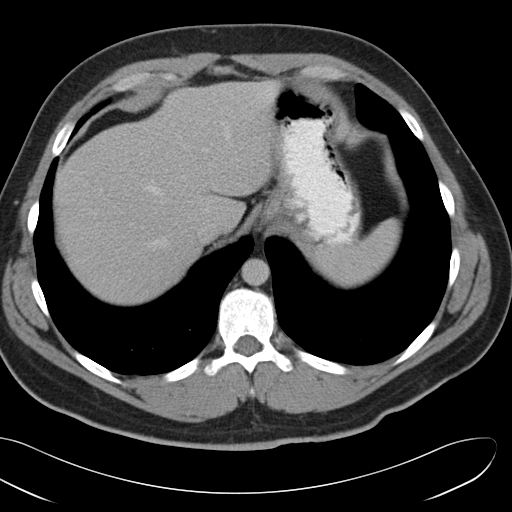
[im 85/98  lung]
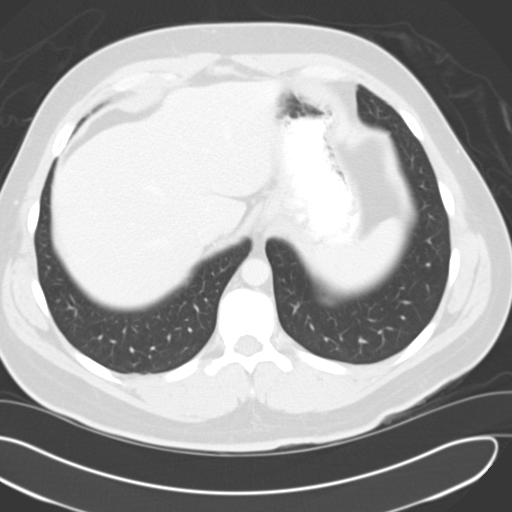
[im 89/98  lung]
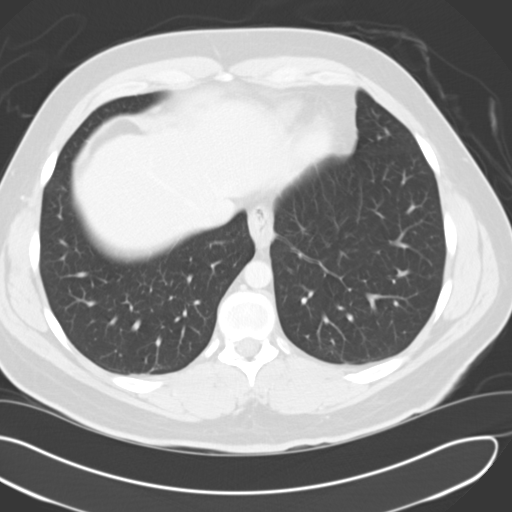
[im 93/98  soft-tissue]
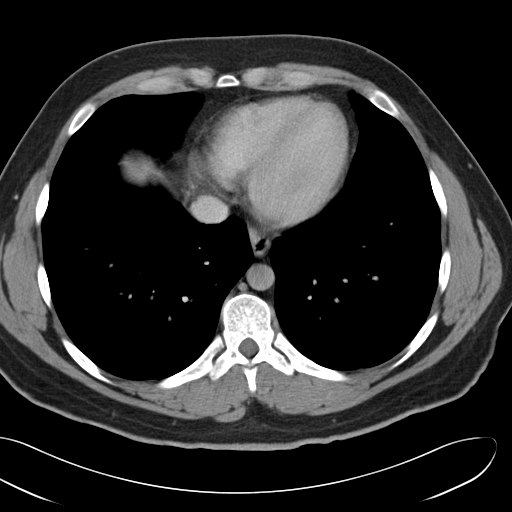
[im 93/98  lung]
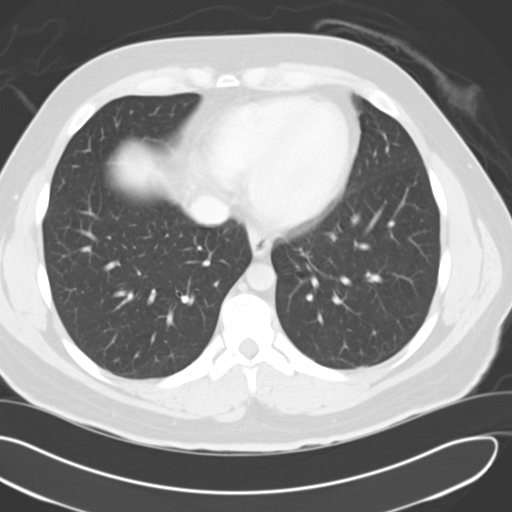

[15 of 32 positions shown; findings below may reference images not displayed]

FINDINGS: The visualized lung bases are clear.

The liver and spleen are unremarkable in appearance. The gallbladder
is within normal limits. The pancreas and adrenal glands are
unremarkable.

The kidneys are unremarkable in appearance. There is no evidence of
hydronephrosis. No renal or ureteral stones are seen. No perinephric
stranding is appreciated.

No free fluid is identified. The small bowel is unremarkable in
appearance. The stomach is within normal limits. No acute vascular
abnormalities are seen.

The appendix appears distended to 1.0 cm in maximal diameter, with
mild surrounding soft tissue inflammation, raising concern for mild
appendicitis, though this is difficult to fully differentiate from
adjacent loops of small bowel. A few mildly prominent pericecal
nodes are seen.

The colon is unremarkable in appearance.

The bladder is mildly distended and grossly unremarkable. A small
urachal remnant is incidentally seen. The prostate remains normal in
size. No inguinal lymphadenopathy is seen.

No acute osseous abnormalities are identified.
IMPRESSION: Distention of the appendix to 1.0 cm in maximal diameter, with mild
surrounding soft tissue inflammation, raising concern for mild
appendicitis, though this is difficult to fully differentiate from
adjacent loops of small bowel. Few mildly prominent pericecal nodes
seen. No evidence of perforation or abscess formation at this time.

These results were called by telephone at the time of interpretation
on 11/30/2015 at [DATE] to Dr. CAMILO ARTURO SNEIDER, who verbally
acknowledged these results.

## 2020-12-02 NOTE — Progress Notes (Unsigned)
Sleep Medicine   Office Visit  Patient Name: Isaac Marquez DOB: 06-Nov-1990 MRN [id number]    Chief Complaint: tired  Brief History:  Isaac Marquez presents with a several year history of sleepiness and poor sleep quality. The patient's bed partner reports  Loud snoring and apneas at night. He has gained 25lbs in the past few years The patient reports very restless sleep but is unaware of waking with snoring, gasping etc. The patient goes to bed by 11 p.m. and wakes up between 6-6:30 a.m.Marland KitchenHe never feels rested when he wakes. Patient has noted no restlessness of his legs at night.  The patient  relates no unusual behavior during the night.  The patient reports a history of daytime sleepiness. He fights sleep whenever he sits. He will nap when he can. The Epworth Sleepiness Score is 14 out of 24 .  The patient reports problems with focus and memory.    ROS  General: (-) fever, (-) chills, (-) night sweat Nose and Sinuses: (-) nasal stuffiness or itchiness, (-) postnasal drip, (-) nosebleeds, (-) sinus trouble. Mouth and Throat: (-) sore throat, (-) hoarseness. Neck: (-) swollen glands, (-) enlarged thyroid, (-) neck pain. Respiratory: - cough, - shortness of breath, - wheezing. Neurologic: - numbness, - tingling. Psychiatric: - anxiety, - depression Sleep behavior: -sleep paralysis -hypnogogic hallucinations -dream enactment      -vivid dreams -cataplexy -night terrors -sleep walking   Current Medication: Outpatient Encounter Medications as of 12/03/2020  Medication Sig  . phentermine 37.5 MG capsule Take by mouth.  Marland Kitchen ascorbic acid (VITAMIN C) 1000 MG tablet Take by mouth.  . Cholecalciferol 25 MCG (1000 UT) capsule Take by mouth.  Marland Kitchen ibuprofen (ADVIL,MOTRIN) 600 MG tablet Take 600 mg by mouth every 6 (six) hours as needed.  . Loratadine 10 MG CAPS Take by mouth.  . Multiple Vitamins-Minerals (BPROTECTED MULTI-VITE PO) Take 1 packet by mouth daily.   No facility-administered encounter  medications on file as of 12/03/2020.    Surgical History: Past Surgical History:  Procedure Laterality Date  . LAPAROSCOPIC APPENDECTOMY N/A 11/30/2015   Procedure: APPENDECTOMY LAPAROSCOPIC;  Surgeon: [place] Gonczy, MD;  Location: [facility] ORS;  Service: General;  Laterality: N/A;  . TONSILLECTOMY      Medical History: Past Medical History:  Diagnosis Date  . Appendicitis 11/30/2015    Family History: Non contributory to the present illness  Social History: Social History   Socioeconomic History  . Marital status: Single    Spouse name: Not on file  . Number of children: Not on file  . Years of education: Not on file  . Highest education level: Not on file  Occupational History  . Not on file  Tobacco Use  . Smoking status: Former Smoker    Packs/day: 1.00    Types: Cigarettes  . Smokeless tobacco: Former Engineer, water and Sexual Activity  . Alcohol use: Yes  . Drug use: No  . Sexual activity: Not on file  Other Topics Concern  . Not on file  Social History Narrative  . Not on file   Social Determinants of Health   Financial Resource Strain: Not on file  Food Insecurity: Not on file  Transportation Needs: Not on file  Physical Activity: Not on file  Stress: Not on file  Social Connections: Not on file  Intimate Partner Violence: Not on file    Vital Signs: Blood pressure 129/84, pulse 67, temperature 97.9 F (36.6 C), resp. rate 18, height 6\' 2"  (1.88 m),  weight 280 lb (127 kg), SpO2 98 %.  Examination: General Appearance: The patient is well-developed, well-nourished, and in no distress. Neck Circumference: 46 cm Skin: Gross inspection of skin unremarkable. Head: normocephalic, no gross deformities. Eyes: no gross deformities noted. ENT: ears appear grossly normal Neurologic: Alert and oriented. No involuntary movements.    EPWORTH SLEEPINESS SCALE:  Scale:  (0)= no chance of dozing; (1)= slight chance of dozing; (2)= moderate chance of dozing;  (3)= high chance of dozing  Chance  Situtation    Sitting and reading: 3    Watching TV: 1    Sitting Inactive in public: 1    As a passenger in car: 2      Lying down to rest: 3    Sitting and talking: 0    Sitting quielty after lunch: 3    In a car, stopped in traffic: 1   TOTAL SCORE:   14 out of 24    SLEEP STUDIES:  1. none   LABS: No results found for this or any previous visit (from the past 2160 hour(s)).  Radiology: CT Abdomen Pelvis W Contrast  Result Date: 11/30/2015 CLINICAL DATA:  Acute onset of right lower quadrant abdominal pain and nausea. Initial encounter. EXAM: CT ABDOMEN AND PELVIS WITH CONTRAST TECHNIQUE: Multidetector CT imaging of the abdomen and pelvis was performed using the standard protocol following bolus administration of intravenous contrast. CONTRAST:  OMNIPAQUE IOHEXOL 300 MG/ML  SOLN COMPARISON:  None. FINDINGS: The visualized lung bases are clear. The liver and spleen are unremarkable in appearance. The gallbladder is within normal limits. The pancreas and adrenal glands are unremarkable. The kidneys are unremarkable in appearance. There is no evidence of hydronephrosis. No renal or ureteral stones are seen. No perinephric stranding is appreciated. No free fluid is identified. The small bowel is unremarkable in appearance. The stomach is within normal limits. No acute vascular abnormalities are seen. The appendix appears distended to 1.0 cm in maximal diameter, with mild surrounding soft tissue inflammation, raising concern for mild appendicitis, though this is difficult to fully differentiate from adjacent loops of small bowel. A few mildly prominent pericecal nodes are seen. The colon is unremarkable in appearance. The bladder is mildly distended and grossly unremarkable. A small urachal remnant is incidentally seen. The prostate remains normal in size. No inguinal lymphadenopathy is seen. No acute osseous abnormalities are identified.  IMPRESSION: Distention of the appendix to 1.0 cm in maximal diameter, with mild surrounding soft tissue inflammation, raising concern for mild appendicitis, though this is difficult to fully differentiate from adjacent loops of small bowel. Few mildly prominent pericecal nodes seen. No evidence of perforation or abscess formation at this time. These results were called by telephone at the time of interpretation on 11/30/2015 at 1:01 am to Dr. Lucrezia Europe, who verbally acknowledged these results. Electronically Signed   By: Roanna Raider M.D.   On: 11/30/2015 01:02    No results found.  No results found.    Assessment and Plan: There are no problems to display for this patient.    PLAN OSA:   Patient evaluation suggests high risk of sleep disordered breathing due to loud snoring, witnessed apnea, poorly refreshing and restless sleep with daytime sleepiness and cognitive impairment.     1. Hypersomnia a PSG will be ordered.Follow up 30+ days after set up if indicated.  2. Obesity (BMI 35.0-39.9 without comorbidity) Obesity Counseling: Had a lengthy discussion regarding patients BMI and weight issues. Patient was instructed on  portion control as well as increased activity. Also discussed caloric restrictions with trying to maintain intake less than 2000 Kcal. Discussions were made in accordance with the 5As of weight management. Simple actions such as not eating late and if able to, taking a walk is suggested.   General Counseling: I have discussed the findings of the evaluation and examination with Isaac Marquez.  I have also discussed any further diagnostic evaluation thatmay be needed or ordered today. Isaac Marquez verbalizes understanding of the findings of todays visit. We also reviewed his medications today and discussed drug interactions and side effects including but not limited excessive drowsiness and altered mental states. We also discussed that there is always a risk not just to him but  also people around him. he has been encouraged to call the office with any questions or concerns that should arise related to todays visit.  No orders of the defined types were placed in this encounter.       I have personally obtained a history, evaluated the patient, evaluated pertinent data, formulated the assessment and plan and placed orders.   Valentino Hue Sol Blazing, PhD, FAASM  Diplomate, American Board of Sleep Medicine    Yevonne Pax, MD [facility] Diplomate ABMS Pulmonary and Critical Care Medicine Sleep medicine

## 2020-12-03 ENCOUNTER — Ambulatory Visit (INDEPENDENT_AMBULATORY_CARE_PROVIDER_SITE_OTHER): Payer: Managed Care, Other (non HMO) | Admitting: Internal Medicine

## 2020-12-03 DIAGNOSIS — E669 Obesity, unspecified: Secondary | ICD-10-CM

## 2020-12-03 DIAGNOSIS — G4710 Hypersomnia, unspecified: Secondary | ICD-10-CM

## 2022-08-14 ENCOUNTER — Ambulatory Visit
Admission: RE | Admit: 2022-08-14 | Discharge: 2022-08-14 | Disposition: A | Discharge status: Home / Self Care | Payer: Managed Care, Other (non HMO) | Source: Ambulatory Visit | Attending: Internal Medicine | Admitting: Internal Medicine

## 2022-08-14 VITALS — BP 122/75 | HR 80 | Temp 98.2°F | Resp 15 | Ht 74.0 in | Wt 260.0 lb

## 2022-08-14 DIAGNOSIS — J00 Acute nasopharyngitis [common cold]: Secondary | ICD-10-CM | POA: Diagnosis not present

## 2022-08-14 LAB — GROUP A STREP BY PCR: Group A Strep by PCR: NOT DETECTED

## 2022-08-14 MED ORDER — AZITHROMYCIN 250 MG PO TABS: ORAL_TABLET | ORAL | 0 refills | Status: AC

## 2022-08-14 NOTE — ED Triage Notes (Signed)
Patient c/o dry cough, nasal congestion, head pressure, and chills that started on Monday. Patient denies fevers.  Patient took 2 home covid tests and both were negative.  Patient reports sore throat that started on Thursday.

## 2022-08-14 NOTE — Discharge Instructions (Addendum)
You were seen today for upper respiratory symptoms.  Your strep test is negative.  I am diagnosing you with a upper respiratory infection.  I am treating you with antibiotics for the next 5 days.  You may take Delsym or NyQuil OTC as needed for cough.  Please follow-up if your symptoms persist or worsen.

## 2022-08-14 NOTE — ED Provider Notes (Signed)
[facility]    CSN: [id number] Arrival date & time: 08/14/22  1154      History   Chief Complaint Chief Complaint  Patient presents with   Cough    Appointment   Sinus Problem    HPI Isaac Marquez is a 31 y.o. male presents to UC today with complaint of headache, runny nose, nasal congestion, sore throat, cough and chest congestion.  He reports this started 6 days ago.  The headache is located in his forehead.  He is not blowing much mucus out of his nose.  He denies difficulty swallowing.  The cough is productive of brown mucus.  He denies ear pain, chest pain, nausea, vomiting or diarrhea.  He denies fevers but has had chills.  He has tried Mucinex and cough syrup OTC with minimal relief of symptoms.  He has not had sick contacts that he is aware of.  He has taken a home COVID test which was negative.  HPI  Past Medical History:  Diagnosis Date   Appendicitis 11/30/2015    There are no problems to display for this patient.   Past Surgical History:  Procedure Laterality Date   LAPAROSCOPIC APPENDECTOMY N/A 11/30/2015   Procedure: APPENDECTOMY LAPAROSCOPIC;  Surgeon: [place] Gonczy, MD;  Location: [facility] ORS;  Service: General;  Laterality: N/A;   TONSILLECTOMY         Home Medications    Prior to Admission medications   Medication Sig Start Date End Date Taking? Authorizing Provider  azithromycin (ZITHROMAX) 250 MG tablet Take 2 tabs today, then 1 tab daily x 4 days 08/14/22  Yes Katara Griner, Salvadore Oxford, NP  ascorbic acid (VITAMIN C) 1000 MG tablet Take by mouth.    [provider]  Cholecalciferol 25 MCG (1000 UT) capsule Take by mouth.    [provider]  ibuprofen (ADVIL,MOTRIN) 600 MG tablet Take 600 mg by mouth every 6 (six) hours as needed.    [provider]  Loratadine 10 MG CAPS Take by mouth.    [provider]  Multiple Vitamins-Minerals (BPROTECTED MULTI-VITE PO) Take 1 packet by mouth daily.    [provider]   phentermine 37.5 MG capsule Take by mouth. 11/19/20   [provider]    Family History Family History  Problem Relation Age of Onset   Rheum arthritis Mother     Social History Social History   Tobacco Use   Smoking status: Former    Packs/day: 1.00    Types: Cigarettes   Smokeless tobacco: Former  Building services engineer Use: Never used  Substance Use Topics   Alcohol use: Yes   Drug use: No     Allergies   Patient has no known allergies.   Review of Systems Review of Systems  Constitutional:  Positive for chills and fatigue. Negative for fever.  HENT:  Positive for congestion, rhinorrhea and sore throat. Negative for ear pain, sinus pressure, sinus pain and trouble swallowing.   Eyes:  Negative for pain and redness.  Respiratory:  Positive for cough. Negative for chest tightness and shortness of breath.   Cardiovascular:  Negative for chest pain and palpitations.  Gastrointestinal:  Negative for diarrhea, nausea and vomiting.  Musculoskeletal:  Positive for myalgias.  Skin:  Negative for rash.  Neurological:  Positive for headaches. Negative for dizziness and light-headedness.     Physical Exam Triage Vital Signs ED Triage Vitals  Enc Vitals Group     BP 08/14/22 1211 122/75  Pulse Rate 08/14/22 1211 80     Resp 08/14/22 1211 15     Temp 08/14/22 1211 98.2 F (36.8 C)     Temp Source 08/14/22 1211 Oral     SpO2 08/14/22 1211 99 %     Weight 08/14/22 1208 260 lb (117.9 kg)     Height 08/14/22 1208 6\' 2"  (1.88 m)     Head Circumference --      Peak Flow --      Pain Score 08/14/22 1208 6     Pain Loc --      Pain Edu? --      Excl. in GC? --    No data found.  Updated Vital Signs BP 122/75 (BP Location: Left Arm)   Pulse 80   Temp 98.2 F (36.8 C) (Oral)   Resp 15   Ht 6\' 2"  (1.88 m)   Wt 260 lb (117.9 kg)   SpO2 99%   BMI 33.38 kg/m      Physical Exam Constitutional:      Appearance: Normal appearance.  HENT:     Head:  Normocephalic.     Right Ear: Tympanic membrane, ear canal and external ear normal.     Left Ear: Tympanic membrane, ear canal and external ear normal.     Nose: Congestion present. No rhinorrhea.     Mouth/Throat:     Mouth: Mucous membranes are moist.     Pharynx: Oropharyngeal exudate and posterior oropharyngeal erythema present.     Comments: Exudate noted of posterior pharynx, no tonsillar exudate noted Eyes:     Extraocular Movements: Extraocular movements intact.     Conjunctiva/sclera: Conjunctivae normal.     Pupils: Pupils are equal, round, and reactive to light.  Cardiovascular:     Rate and Rhythm: Normal rate and regular rhythm.  Pulmonary:     Effort: Pulmonary effort is normal.     Breath sounds: Rhonchi present. No wheezing or rales.  Lymphadenopathy:     Cervical: No cervical adenopathy.  Skin:    General: Skin is warm and dry.     Findings: No rash.  Neurological:     General: No focal deficit present.     Mental Status: He is alert and oriented to person, place, and time.      UC Treatments / Results  Labs  Labs Reviewed  GROUP A STREP BY PCR     Medications Ordered in UC Medications - No data to display  Initial Impression / Assessment and Plan / UC Course  I have reviewed the triage vital signs and the nursing notes.  Pertinent labs & imaging results that were available during my care of the patient were reviewed by me and considered in my medical decision making (see chart for details).     31 year old male with 1 week history of headache, runny nose, nasal congestion, sore throat, cough and chest tightness.  DDx include viral URI with cough, upper respiratory infection, viral pharyngitis, bacterial pharyngitis, acute bronchitis, pneumonia.  No indication to check for COVID/flu given duration of symptoms and he would not qualify for antiviral therapy.  Rapid strep negative.  I plan to treat with Azithromycin 250 mg daily x5 days given the coarse  rhonchi in his chest.  Recommend NyQuil OTC as needed for cough.  Advised him to follow-up with his PCP if symptoms persist or worsen.  Final Clinical Impressions(s) / UC Diagnoses   Final diagnoses:  Acute nasopharyngitis     Discharge  Instructions      You were seen today for upper respiratory symptoms.  Your strep test is negative.  I am diagnosing you with a upper respiratory infection.  I am treating you with antibiotics for the next 5 days.  You may take Delsym or NyQuil OTC as needed for cough.  Please follow-up if your symptoms persist or worsen.     ED Prescriptions     Medication Sig Dispense Auth. Provider   azithromycin (ZITHROMAX) 250 MG tablet Take 2 tabs today, then 1 tab daily x 4 days 6 tablet Ka Flammer, Salvadore Oxford, NP      PDMP not reviewed this encounter.   Lorre Munroe, NP 08/14/22 1250
